# Patient Record
Sex: Female | Born: 2005
Health system: Southern US, Community
[De-identification: ages and names within clinical notes are randomized; demographics above are authoritative.]

---
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
## 2024-01-05 ENCOUNTER — Emergency Department (HOSPITAL_COMMUNITY)
Admission: EM | Admit: 2024-01-05 | Discharge: 2024-01-05 | Disposition: A | Discharge status: Home / Self Care | Attending: Emergency Medicine | Admitting: Emergency Medicine

## 2024-01-05 ENCOUNTER — Other Ambulatory Visit: Payer: Self-pay

## 2024-01-05 ENCOUNTER — Encounter (HOSPITAL_COMMUNITY): Payer: Self-pay | Admitting: *Deleted

## 2024-01-05 ENCOUNTER — Emergency Department (HOSPITAL_COMMUNITY)

## 2024-01-05 DIAGNOSIS — T782XXA Anaphylactic shock, unspecified, initial encounter: Secondary | ICD-10-CM | POA: Diagnosis not present

## 2024-01-05 DIAGNOSIS — T7840XA Allergy, unspecified, initial encounter: Secondary | ICD-10-CM | POA: Diagnosis present

## 2024-01-05 LAB — CBC WITH DIFFERENTIAL/PLATELET
Abs Immature Granulocytes: 0.03 K/uL (ref 0.00–0.07)
Basophils Absolute: 0.1 K/uL (ref 0.0–0.1)
Basophils Relative: 1 %
Eosinophils Absolute: 0.2 K/uL (ref 0.0–0.5)
Eosinophils Relative: 1 %
HCT: 40.6 % (ref 36.0–46.0)
Hemoglobin: 13.5 g/dL (ref 12.0–15.0)
Immature Granulocytes: 0 %
Lymphocytes Relative: 35 %
Lymphs Abs: 4 K/uL (ref 0.7–4.0)
MCH: 29.2 pg (ref 26.0–34.0)
MCHC: 33.3 g/dL (ref 30.0–36.0)
MCV: 87.9 fL (ref 80.0–100.0)
Monocytes Absolute: 0.6 K/uL (ref 0.1–1.0)
Monocytes Relative: 5 %
Neutro Abs: 6.6 K/uL (ref 1.7–7.7)
Neutrophils Relative %: 58 %
Platelets: 544 K/uL — ABNORMAL HIGH (ref 150–400)
RBC: 4.62 MIL/uL (ref 3.87–5.11)
RDW: 12 % (ref 11.5–15.5)
WBC: 11.4 K/uL — ABNORMAL HIGH (ref 4.0–10.5)
nRBC: 0 % (ref 0.0–0.2)

## 2024-01-05 LAB — BASIC METABOLIC PANEL WITH GFR
Anion gap: 16 — ABNORMAL HIGH (ref 5–15)
BUN: 15 mg/dL (ref 6–20)
CO2: 17 mmol/L — ABNORMAL LOW (ref 22–32)
Calcium: 9.2 mg/dL (ref 8.9–10.3)
Chloride: 106 mmol/L (ref 98–111)
Creatinine, Ser: 0.63 mg/dL (ref 0.44–1.00)
GFR, Estimated: >60 mL/min (ref >60)
Glucose, Bld: 138 mg/dL — ABNORMAL HIGH (ref 70–99)
Potassium: 3 mmol/L — ABNORMAL LOW (ref 3.5–5.1)
Sodium: 139 mmol/L (ref 135–145)

## 2024-01-05 MED ORDER — SODIUM CHLORIDE 0.9 % IV SOLN: INTRAVENOUS | Status: DC

## 2024-01-05 MED ORDER — DIPHENHYDRAMINE HCL 50 MG/ML IJ SOLN
25.0000 mg | Freq: Once | INTRAMUSCULAR | Status: AC
  Administered 2024-01-05: 25 mg via INTRAVENOUS
  Filled 2024-01-05: qty 1

## 2024-01-05 MED ORDER — METHYLPREDNISOLONE SODIUM SUCC 125 MG IJ SOLR
125.0000 mg | Freq: Once | INTRAMUSCULAR | Status: AC
  Administered 2024-01-05: 125 mg via INTRAVENOUS
  Filled 2024-01-05: qty 2

## 2024-01-05 MED ORDER — PREDNISONE 10 MG (21) PO TBPK: ORAL_TABLET | Freq: Every day | ORAL | 0 refills | Status: AC

## 2024-01-05 MED ORDER — EPINEPHRINE 0.3 MG/0.3ML IJ SOAJ: 0.3000 mg | INTRAMUSCULAR | 0 refills | Status: AC | PRN

## 2024-01-05 MED ORDER — SODIUM CHLORIDE 0.9 % IV BOLUS
1000.0000 mL | Freq: Once | INTRAVENOUS | Status: AC
  Administered 2024-01-05: 1000 mL via INTRAVENOUS

## 2024-01-05 MED ORDER — EPINEPHRINE 0.3 MG/0.3ML IJ SOAJ
0.3000 mg | Freq: Once | INTRAMUSCULAR | Status: AC
  Administered 2024-01-05: 0.3 mg via INTRAMUSCULAR

## 2024-01-05 MED ORDER — POTASSIUM CHLORIDE CRYS ER 20 MEQ PO TBCR
40.0000 meq | EXTENDED_RELEASE_TABLET | Freq: Once | ORAL | Status: AC
  Administered 2024-01-05: 40 meq via ORAL
  Filled 2024-01-05: qty 2

## 2024-01-05 MED ORDER — FAMOTIDINE IN NACL 20-0.9 MG/50ML-% IV SOLN
20.0000 mg | Freq: Once | INTRAVENOUS | Status: AC
  Administered 2024-01-05: 20 mg via INTRAVENOUS
  Filled 2024-01-05: qty 50

## 2024-01-05 NOTE — ED Triage Notes (Signed)
 BIB family for allergic reaction c/w anaphylaxis s/p eating peanut butter pretzels, c/o itching, swelling, sob, nausea, abd pain, anxiety. Taken directly to exam room. EDPA and primary RN notified.

## 2024-01-05 NOTE — ED Provider Notes (Signed)
 [place] EMERGENCY DEPARTMENT AT [facility] Provider Note   CSN: [id number] Arrival date & time: 01/05/24  1620     Patient presents with: Allergic Reaction   Jaime Cardenas is a 18 y.o. female who presents to the ED and acute respiratory distress and skin rash after having had peanut butter.  She states he has never had an allergic reaction to peanuts in the past however shortly after exposure to peanuts this time had onset of rash and shortness of breath.  No other notable medical history, no previous history of any allergic/anaphylactic type reactions.    Allergic Reaction Presenting symptoms: rash        Prior to Admission medications   Medication Sig Start Date End Date Taking? Authorizing Provider  EPINEPHrine  0.3 mg/0.3 mL IJ SOAJ injection Inject 0.3 mg into the muscle as needed for anaphylaxis. 01/05/24  Yes Myriam Dorn BROCKS, PA  predniSONE  (STERAPRED UNI-PAK 21 TAB) 10 MG (21) TBPK tablet Take by mouth daily. Take 6 tabs by mouth daily  for 2 days, then 5 tabs for 2 days, then 4 tabs for 2 days, then 3 tabs for 2 days, 2 tabs for 2 days, then 1 tab by mouth daily for 2 days 01/05/24  Yes Myriam Dorn BROCKS, PA  albuterol  (PROVENTIL ) (2.5 MG/3ML) 0.083% nebulizer solution Take 3 mLs (2.5 mg total) by nebulization every 4 (four) hours as needed for wheezing or shortness of breath. 10/04/17   Eilleen Colander, NP  cetirizine  HCl (ZYRTEC ) 1 MG/ML solution Take 10 mLs (10 mg total) by mouth at bedtime. 10/04/17   Eilleen Colander, NP    Allergies: Patient has no known allergies.    Review of Systems  Respiratory:  Positive for chest tightness and shortness of breath.   Skin:  Positive for rash.    Updated Vital Signs BP 110/83   Pulse (!) 124   Temp 97.7 F (36.5 C)   Resp (!) 27   Ht 5' 4 (1.626 m)   Wt 53.5 kg   LMP 12/26/2023 (Exact Date)   SpO2 100%   BMI 20.25 kg/m   Physical Exam Vitals and nursing note reviewed.  Constitutional:      General: She is  in acute distress.     Appearance: Normal appearance.  HENT:     Head: Normocephalic and atraumatic.     Mouth/Throat:     Mouth: Mucous membranes are moist.     Pharynx: Oropharynx is clear.     Comments: No laryngeal edema noted present Eyes:     Extraocular Movements: Extraocular movements intact.     Conjunctiva/sclera: Conjunctivae normal.     Pupils: Pupils are equal, round, and reactive to light.  Cardiovascular:     Rate and Rhythm: Normal rate and regular rhythm.     Pulses: Normal pulses.     Heart sounds: Normal heart sounds. No murmur heard.    No friction rub. No gallop.  Pulmonary:     Effort: Tachypnea, accessory muscle usage and respiratory distress present.     Breath sounds: Normal breath sounds and air entry. No stridor.  Abdominal:     General: Abdomen is flat. Bowel sounds are normal.     Palpations: Abdomen is soft.  Musculoskeletal:        General: Normal range of motion.     Cervical back: Normal range of motion and neck supple.     Right lower leg: No edema.     Left lower leg: No  edema.  Skin:    General: Skin is warm and dry.     Capillary Refill: Capillary refill takes less than 2 seconds.     Findings: Erythema and rash present. Rash is urticarial.     Comments: Global erythema along with urticaria noted  Neurological:     General: No focal deficit present.     Mental Status: She is alert. Mental status is at baseline.  Psychiatric:        Mood and Affect: Mood normal.     (all labs ordered are listed, but only abnormal results are displayed) Labs Reviewed  BASIC METABOLIC PANEL WITH GFR - Abnormal; Notable for the following components:      Result Value   Potassium 3.0 (*)    CO2 17 (*)    Glucose, Bld 138 (*)    Anion gap 16 (*)    All other components within normal limits  CBC WITH DIFFERENTIAL/PLATELET - Abnormal; Notable for the following components:   WBC 11.4 (*)    Platelets 544 (*)    All other components within normal limits   HCG, SERUM, QUALITATIVE    EKG: None  Radiology: [facility] Chest Port 1 View Result Date: 01/05/2024 CLINICAL DATA:  Allergic reaction EXAM: PORTABLE CHEST - 1 VIEW COMPARISON:  None available. FINDINGS: No focal airspace consolidation, pleural effusion, or pneumothorax. No cardiomegaly.No acute fracture or destructive lesion. IMPRESSION: No acute cardiopulmonary abnormality. Electronically Signed   By: Rogelia Myers M.D.   On: 01/05/2024 17:31     Procedures   Medications Ordered in the ED  sodium chloride  0.9 % bolus 1,000 mL (1,000 mLs Intravenous New Bag/Given 01/05/24 1709)    And  0.9 %  sodium chloride  infusion ( Intravenous New Bag/Given 01/05/24 1758)  potassium chloride  SA (KLOR-CON  M) CR tablet 40 mEq (has no administration in time range)  diphenhydrAMINE  (BENADRYL ) injection 25 mg (25 mg Intravenous Given 01/05/24 1708)  EPINEPHrine  (EPI-PEN) injection 0.3 mg (0.3 mg Intramuscular Given 01/05/24 1744)  methylPREDNISolone  sodium succinate (SOLU-MEDROL ) 125 mg/2 mL injection 125 mg (125 mg Intravenous Given 01/05/24 1708)  famotidine  (PEPCID ) IVPB 20 mg premix (0 mg Intravenous Stopped 01/05/24 1737)                                    Medical Decision Making Amount and/or Complexity of Data Reviewed Labs: ordered. Radiology: ordered.  Risk Prescription drug management.   Medical Decision Making:   Jaime Cardenas is a 18 y.o. female who presented to the ED today with allergic reaction detailed above.    Additional history discussed with patient's family/caregivers.  Complete initial physical exam performed, notably the patient  was awake and alert but in acute distress.  She was tachypneic.    Reviewed and confirmed nursing documentation for past medical history, family history, social history.    Initial Assessment:   With the patient's presentation of erythema and urticaria, most likely diagnosis is acute allergic reaction/anaphylaxis.   Initial Plan:  Administer epinephrine   IM for initial management of anaphylaxis Administer IV Solu-Medrol , famotidine , diphenhydramine  for management of allergic reaction/anaphylaxis Screening labs including CBC and Metabolic panel to evaluate for infectious or metabolic etiology of disease.  Urinalysis with reflex culture ordered to evaluate for UTI or relevant urologic/nephrologic pathology.  Assess serum hCG to establish presence of pregnancy CXR to evaluate for structural/infectious intrathoracic pathology.  Objective evaluation as below reviewed   Initial Study  Results:   Laboratory  All laboratory results reviewed without evidence of clinically relevant pathology.   Exceptions include: Hypokalemia of 3.0  Radiology:  All images reviewed independently. Agree with radiology report at this time.   DG Chest Port 1 View Result Date: 01/05/2024 CLINICAL DATA:  Allergic reaction EXAM: PORTABLE CHEST - 1 VIEW COMPARISON:  None available. FINDINGS: No focal airspace consolidation, pleural effusion, or pneumothorax. No cardiomegaly.No acute fracture or destructive lesion. IMPRESSION: No acute cardiopulmonary abnormality. Electronically Signed   By: Rogelia Myers M.D.   On: 01/05/2024 17:31    Reassessment and Plan:   Patient's condition dramatically improved after administration of epinephrine , Solu-Medrol , diphenhydramine .  As such, will discharge patient with outpatient course of prednisone , as well as a prescription for an EpiPen .  Strongly encouraged follow-up with primary care within the next 1 to 2 weeks.  Further noted incidental finding on lab workup of a hypokalemia 3.0.  Patient was given oral potassium for repletion.  Again we will stressed that they follow-up with primary care for redraw of labs to ensure that potassium is returned to normal level.       Final diagnoses:  Allergic reaction, initial encounter  Anaphylaxis, initial encounter    ED Discharge Orders          Ordered    EPINEPHrine  0.3 mg/0.3 mL IJ  SOAJ injection  As needed        01/05/24 1815    predniSONE  (STERAPRED UNI-PAK 21 TAB) 10 MG (21) TBPK tablet  Daily        01/05/24 1815               Myriam Dorn BROCKS, [place] 01/05/24 1819    Jerrol Agent, MD 01/06/24 [phone]

## 2024-01-05 NOTE — ED Notes (Signed)
 Pt reports decrease in itchy feeling and this RN notes a reduction in rash on arms

## 2024-01-05 NOTE — ED Triage Notes (Incomplete)
 PT brought

## 2024-01-05 NOTE — Discharge Instructions (Addendum)
 Regarding the potassium level, please follow-up with your primary care within the next 1 to 2 weeks for retrial of her lab work so that we can establish that her potassium was returned to normal.
